# Patient Record
Sex: Male | Born: 2000 | Race: White | Hispanic: No | Marital: Single | Smoking: Never smoker
Health system: Southern US, Community
[De-identification: ages and names within clinical notes are randomized; demographics above are authoritative.]

## PROBLEM LIST (undated history)

## (undated) DIAGNOSIS — L309 Dermatitis, unspecified: Secondary | ICD-10-CM

## (undated) DIAGNOSIS — J45909 Unspecified asthma, uncomplicated: Secondary | ICD-10-CM

---
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
## 2022-03-05 ENCOUNTER — Ambulatory Visit: Payer: Medicaid Other | Admitting: Orthopaedic Surgery

## 2022-12-26 IMAGING — DX DG FOOT COMPLETE 3+V*R*
3 series · 3 of 3 positions shown · non-contrast
Comparison: None

CLINICAL DATA: Dropped right full on the right foot.  Pain.

EXAM:
RIGHT FOOT COMPLETE - 3+ VIEW

[foot supine dp]
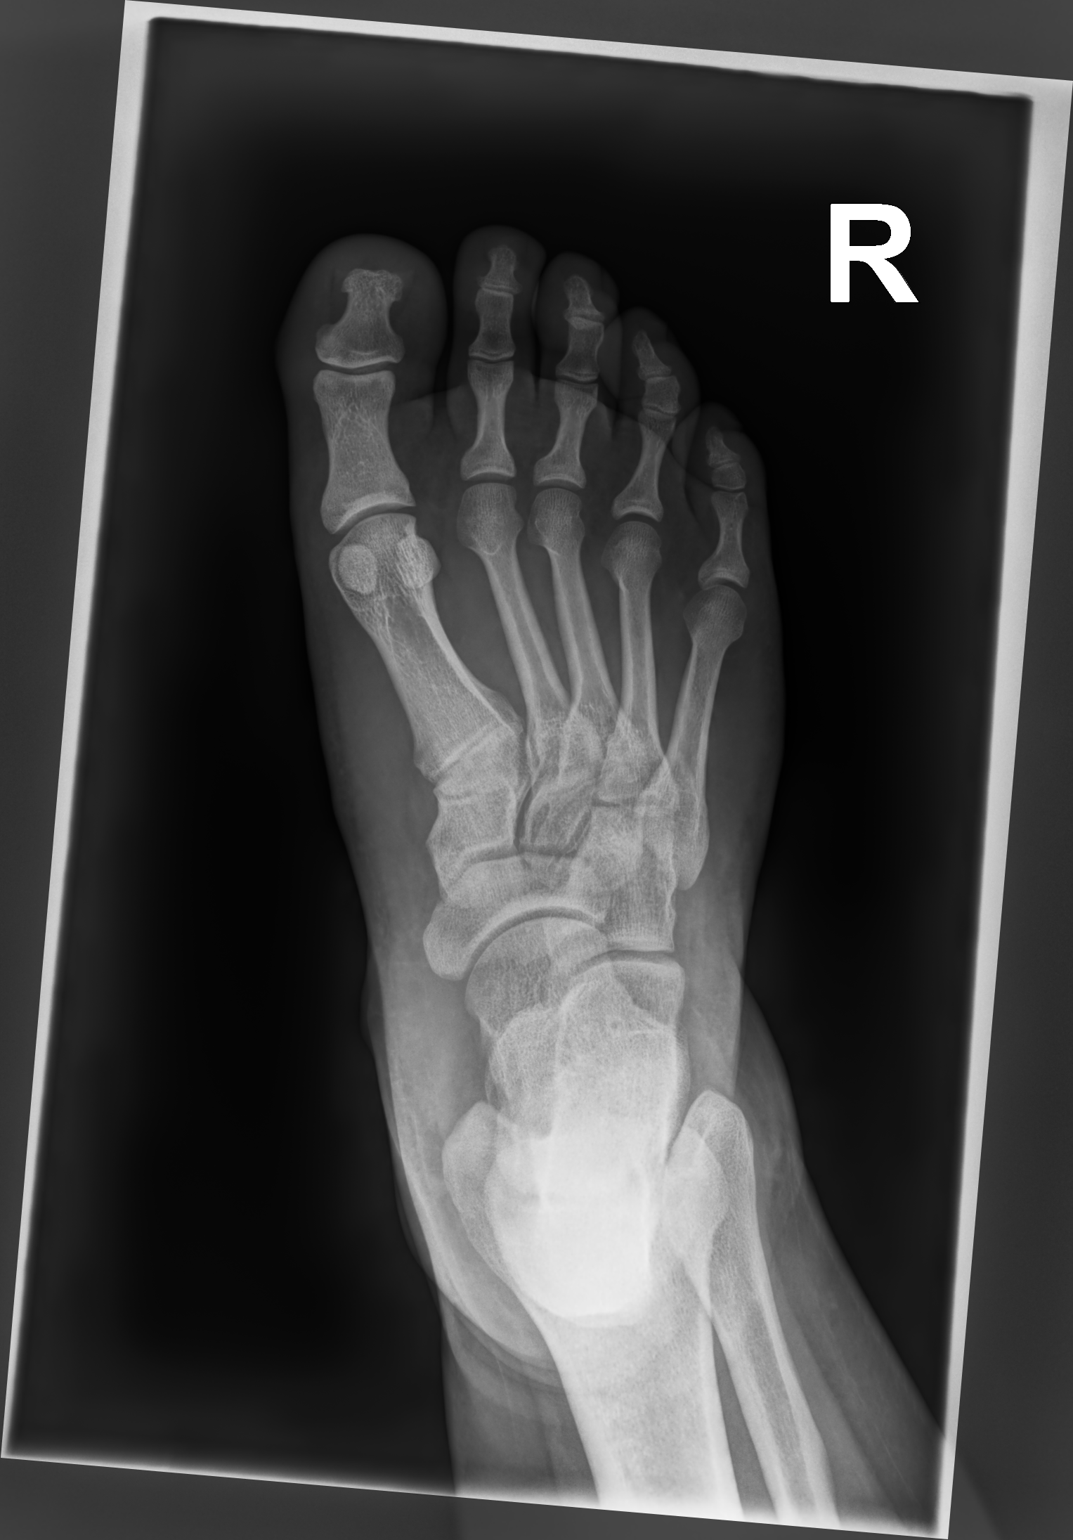

[foot medial oblique]
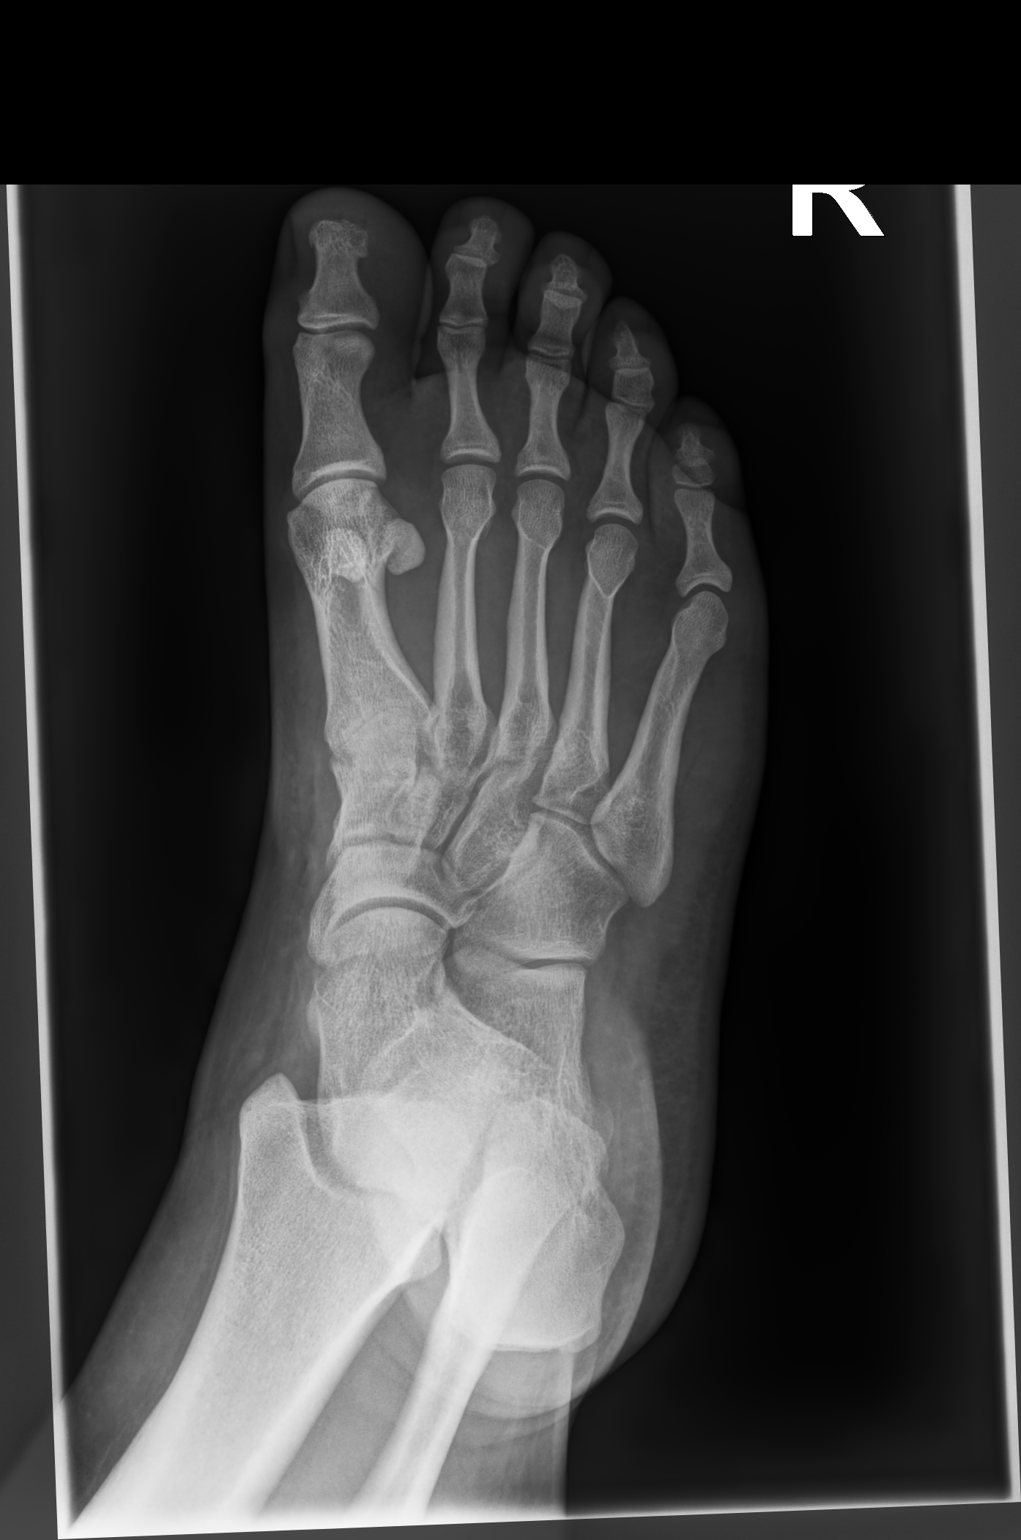

[foot supine lat]
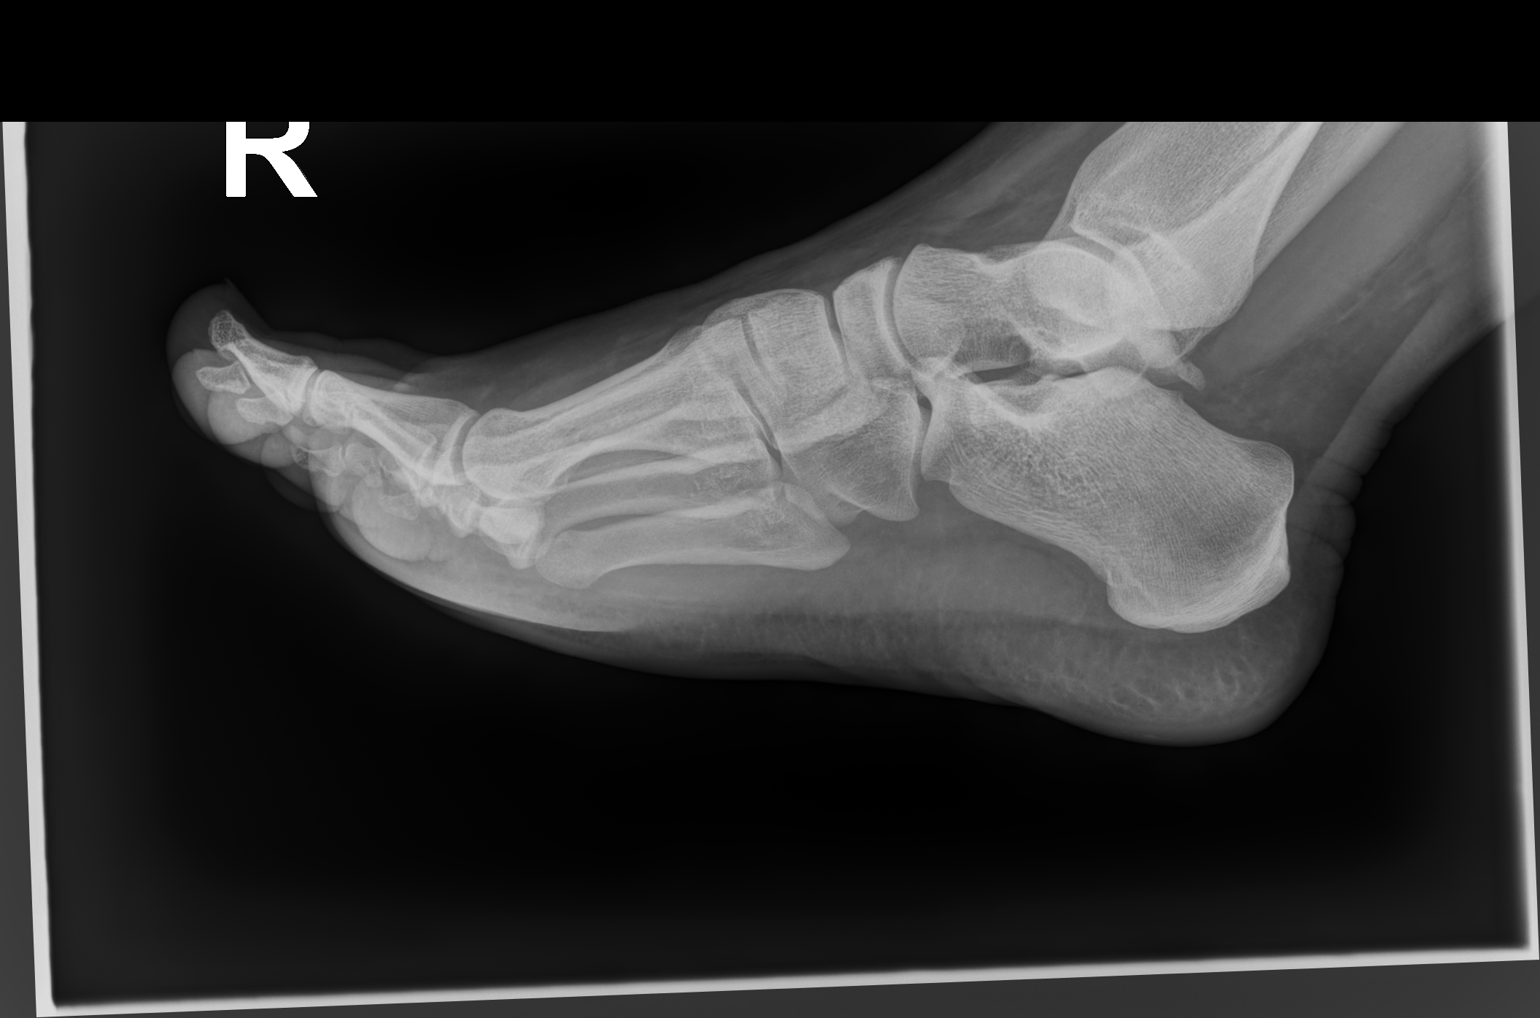

[3 of 3 positions shown; findings below may reference images not displayed]

FINDINGS: Soft swelling is present over the dorsum of the foot. No underlying
fracture is present. No foreign body is present.
IMPRESSION: Soft tissue swelling over the dorsum of the foot without underlying
fracture or foreign body.
